# Patient Record
Sex: Female | Born: 1945 | Race: White | Hispanic: No | Marital: Married | Smoking: Former smoker
Health system: Southern US, Community
[De-identification: ages and names within clinical notes are randomized; demographics above are authoritative.]

---

## 2015-07-14 DIAGNOSIS — M170 Bilateral primary osteoarthritis of knee: Secondary | ICD-10-CM | POA: Insufficient documentation

## 2016-07-05 DIAGNOSIS — H8113 Benign paroxysmal vertigo, bilateral: Secondary | ICD-10-CM | POA: Insufficient documentation

## 2018-02-23 DIAGNOSIS — J4520 Mild intermittent asthma, uncomplicated: Secondary | ICD-10-CM | POA: Insufficient documentation

## 2019-07-26 DIAGNOSIS — M85852 Other specified disorders of bone density and structure, left thigh: Secondary | ICD-10-CM | POA: Insufficient documentation

## 2020-01-11 ENCOUNTER — Other Ambulatory Visit: Payer: Self-pay

## 2020-01-11 ENCOUNTER — Encounter: Payer: Self-pay | Admitting: Emergency Medicine

## 2020-01-11 ENCOUNTER — Emergency Department
Admission: EM | Admit: 2020-01-11 | Discharge: 2020-01-11 | Disposition: A | Discharge status: Home / Self Care | Payer: PRIVATE HEALTH INSURANCE | Source: Home / Self Care | Attending: Family Medicine | Admitting: Family Medicine

## 2020-01-11 ENCOUNTER — Emergency Department (INDEPENDENT_AMBULATORY_CARE_PROVIDER_SITE_OTHER): Payer: PRIVATE HEALTH INSURANCE

## 2020-01-11 DIAGNOSIS — M25552 Pain in left hip: Secondary | ICD-10-CM

## 2020-01-11 MED ORDER — HYDROCODONE-ACETAMINOPHEN 5-325 MG PO TABS: ORAL_TABLET | ORAL | 0 refills | Status: AC

## 2020-01-11 NOTE — Discharge Instructions (Addendum)
Apply ice pack for 20 to 30 minutes, 3 to 4 times daily  Continue until pain decreases.  Use a cane for support and stabilitiy.

## 2020-01-11 NOTE — ED Provider Notes (Signed)
[facility]    CSN: [id number] Arrival date & time: 01/11/20  1136      History   Chief Complaint Chief Complaint  Patient presents with   Hip Pain    left    HPI Hannah Bridges is a 74 y.o. female.   Patient is visiting from [place].  While standing in her hotel room last night, she rotated her body to her right to pick up an item.  She heard a popping sensation followed by sharp pain in her left hip area.  The pain improved somewhat last night after valium, muscle relaxer, and application of ice.  She has pain with weight bearing today.  The history is provided by the patient.  Hip Pain This is a new problem. The current episode started yesterday. The problem occurs constantly. The problem has not changed since onset.Pertinent negatives include no abdominal pain. The symptoms are aggravated by walking, twisting and standing. Nothing relieves the symptoms. Treatments tried: ice pack, valium, and muscle relaxer. The treatment provided mild relief.    History reviewed. No pertinent past medical history.  Patient Active Problem List   Diagnosis Date Noted   Osteopenia of neck of left femur 07/26/2019   Mild intermittent asthma, uncomplicated 02/23/2018   Benign paroxysmal positional vertigo due to bilateral vestibular disorder 07/05/2016   Primary osteoarthritis of both knees 07/14/2015   Intractable back pain 11/20/2009   Osteoarthritis 11/20/2009   Spinal stenosis of lumbar region 11/20/2009   Benign essential hypertension 03/10/2009   Mixed hyperlipidemia 03/10/2009    History reviewed. No pertinent surgical history.  OB History   No obstetric history on file.      Home Medications    Prior to Admission medications   Medication Sig Start Date End Date Taking? Authorizing Provider  ascorbic acid (VITAMIN C) 500 MG tablet Take by mouth daily.   Yes [provider]  Calcium Carbonate-Vitamin D 600-400 MG-UNIT tablet Take by  mouth daily.   Yes [provider]  Coenzyme Q10 200 MG capsule Take by mouth.   Yes [provider]  diazepam (VALIUM) 10 MG tablet take 1/2 tablet (5 mg) by mouth daily as needed for anxiety. max daily amount: 5 mg 12/24/19  Yes [provider]  lisinopril (ZESTRIL) 10 MG tablet Take 10 mg by mouth daily. 01/06/20  Yes [provider]  meloxicam (MOBIC) 15 MG tablet Take 1 tablet by mouth daily. 06/15/15  Yes [provider]  methylPREDNISolone (MEDROL DOSEPAK) 4 MG TBPK tablet Taper dose according to the directions in the packet. 01/03/20  Yes [provider]  Multiple Vitamin (MULTI-VITAMIN) tablet Take by mouth daily.   Yes [provider]  simvastatin (ZOCOR) 20 MG tablet Take 1 tablet by mouth at bedtime. 01/07/20  Yes [provider]  tiZANidine (ZANAFLEX) 2 MG tablet Take 1 tab Twice a day as needed 01/08/20  Yes [provider]  vitamin E 180 MG (400 UNITS) capsule Take by mouth daily.   Yes [provider]  HYDROcodone-acetaminophen (NORCO/VICODIN) 5-325 MG tablet Take one by mouth at bedtime as needed for pain 01/11/20   Lattie Haw, MD  meclizine (ANTIVERT) 25 MG tablet Take 1 tablet by mouth 3 (three) times daily as needed. 12/12/18   [provider]    Family History Family History  Problem Relation Age of Onset   Hypertension Mother    Diabetes Mother    Cancer Father    Hypertension Father  Thyroid disease Sister    Cancer Brother    Thyroid disease Sister    Stroke Sister    Healthy Brother     Social History Social History   Tobacco Use   Smoking status: Former Smoker    Types: Cigarettes    Quit date: 1991    Years since quitting: 30.4   Smokeless tobacco: Never Used  Building services engineer Use: Never used  Substance Use Topics   Alcohol use: Never   Drug use: Never     Allergies   Other   Review of Systems Review of Systems  Constitutional:  Positive for activity change. Negative for appetite change, chills, diaphoresis, fatigue and fever.  Gastrointestinal: Negative for abdominal pain.  Musculoskeletal:       Left hip pain  Neurological: Negative for weakness and numbness.  All other systems reviewed and are negative.    Physical Exam Triage Vital Signs ED Triage Vitals  Enc Vitals Group     BP 01/11/20 1158 (!) 163/92     Pulse Rate 01/11/20 1158 79     Resp 01/11/20 1158 18     Temp 01/11/20 1158 98.6 F (37 C)     Temp Source 01/11/20 1158 Oral     SpO2 01/11/20 1158 98 %     Weight 01/11/20 1200 227 lb (103 kg)     Height 01/11/20 1200 5\' 3"  (1.6 m)     Head Circumference --      Peak Flow --      Pain Score 01/11/20 1159 6     Pain Loc --      Pain Edu? --      Excl. in GC? --    No data found.  Updated Vital Signs BP (!) 163/92 (BP Location: Right Arm)    Pulse 79    Temp 98.6 F (37 C) (Oral)    Resp 18    Ht 5\' 3"  (1.6 m)    Wt 103 kg    SpO2 98%    BMI 40.21 kg/m   Visual Acuity Right Eye Distance:   Left Eye Distance:   Bilateral Distance:    Right Eye Near:   Left Eye Near:    Bilateral Near:     Physical Exam Constitutional:      General: She is not in acute distress.    Appearance: She is obese.  HENT:     Head: Normocephalic.     Nose: Nose normal.  Eyes:     Pupils: Pupils are equal, round, and reactive to light.  Cardiovascular:     Rate and Rhythm: Normal rate.     Heart sounds: Normal heart sounds.  Pulmonary:     Effort: Pulmonary effort is normal.     Breath sounds: Normal breath sounds.  Abdominal:     Tenderness: There is no abdominal tenderness.  Musculoskeletal:     Left hip: Tenderness and bony tenderness present. No crepitus. Decreased range of motion.     Right lower leg: No edema.     Left lower leg: No edema.       Legs:     Comments: There is tenderness in the vicinity of the left greater trochanter.  Patient cannot actively flex her left hip, and cannot  passively flex more than about 20 degrees.  She has pain with internal/external passive rotation of her left hip.  Skin:    General: Skin is warm and dry.  Findings: No rash.  Neurological:     Mental Status: She is alert.      UC Treatments / Results  Labs (all labs ordered are listed, but only abnormal results are displayed) Labs Reviewed - No data to display  EKG   Radiology DG Hip Unilat W or Wo Pelvis 2-3 Views Left  Result Date: 01/11/2020 CLINICAL DATA:  Sudden onset of left hip pain yesterday. EXAM: DG HIP (WITH OR WITHOUT PELVIS) 2-3V LEFT COMPARISON:  None. FINDINGS: There is no evidence of hip fracture or dislocation. There is no evidence of arthropathy or other focal bone abnormality. L4-5 lumbar discectomy and fusion is visible. IMPRESSION: Negative pelvis and left hip. Electronically Signed   By: Nelson Chimes M.D.   On: 01/11/2020 13:00    Procedures Procedures (including critical care time)  Medications Ordered in UC Medications - No data to display  Initial Impression / Assessment and Plan / UC Course  I have reviewed the triage vital signs and the nursing notes.  Pertinent labs & imaging results that were available during my care of the patient were reviewed by me and considered in my medical decision making (see chart for details).    Rx for Vicodin at bedtime (#5, no refill). Controlled Substance Prescriptions I have consulted the [place] Controlled Substances Registry for this patient, and feel the risk/benefit ratio today is favorable for proceeding with this prescription for a controlled substance.   Recommend follow-up with Sports Medicine physician in [place].   Final Clinical Impressions(s) / UC Diagnoses   Final diagnoses:  Acute pain of left hip     Discharge Instructions     Apply ice pack for 20 to 30 minutes, 3 to 4 times daily  Continue until pain decreases.  Use a cane for support and stabilitiy.    ED Prescriptions    Medication  Sig Dispense Auth. Provider   HYDROcodone-acetaminophen (NORCO/VICODIN) 5-325 MG tablet Take one by mouth at bedtime as needed for pain 5 tablet Kandra Nicolas, MD        Kandra Nicolas, MD 01/12/20 1304

## 2020-01-11 NOTE — ED Triage Notes (Signed)
Left hip pain after turning to right last night  Pt heard a cracking noise & sharp pain Took a muscle relaxer, valium & ice to hip all night - now able to stand on it  Pain radiates down to left calf COVID vaccine Pfizer 2nd one April 2021 [place] visiting from [place]

## 2021-01-06 IMAGING — DX DG HIP (WITH OR WITHOUT PELVIS) 2-3V*L*
3 series · 3 of 3 positions shown · non-contrast
Comparison: None.

CLINICAL DATA: Sudden onset of left hip pain yesterday.

EXAM:
DG HIP (WITH OR WITHOUT PELVIS) 2-3V LEFT

[pelvis ap]
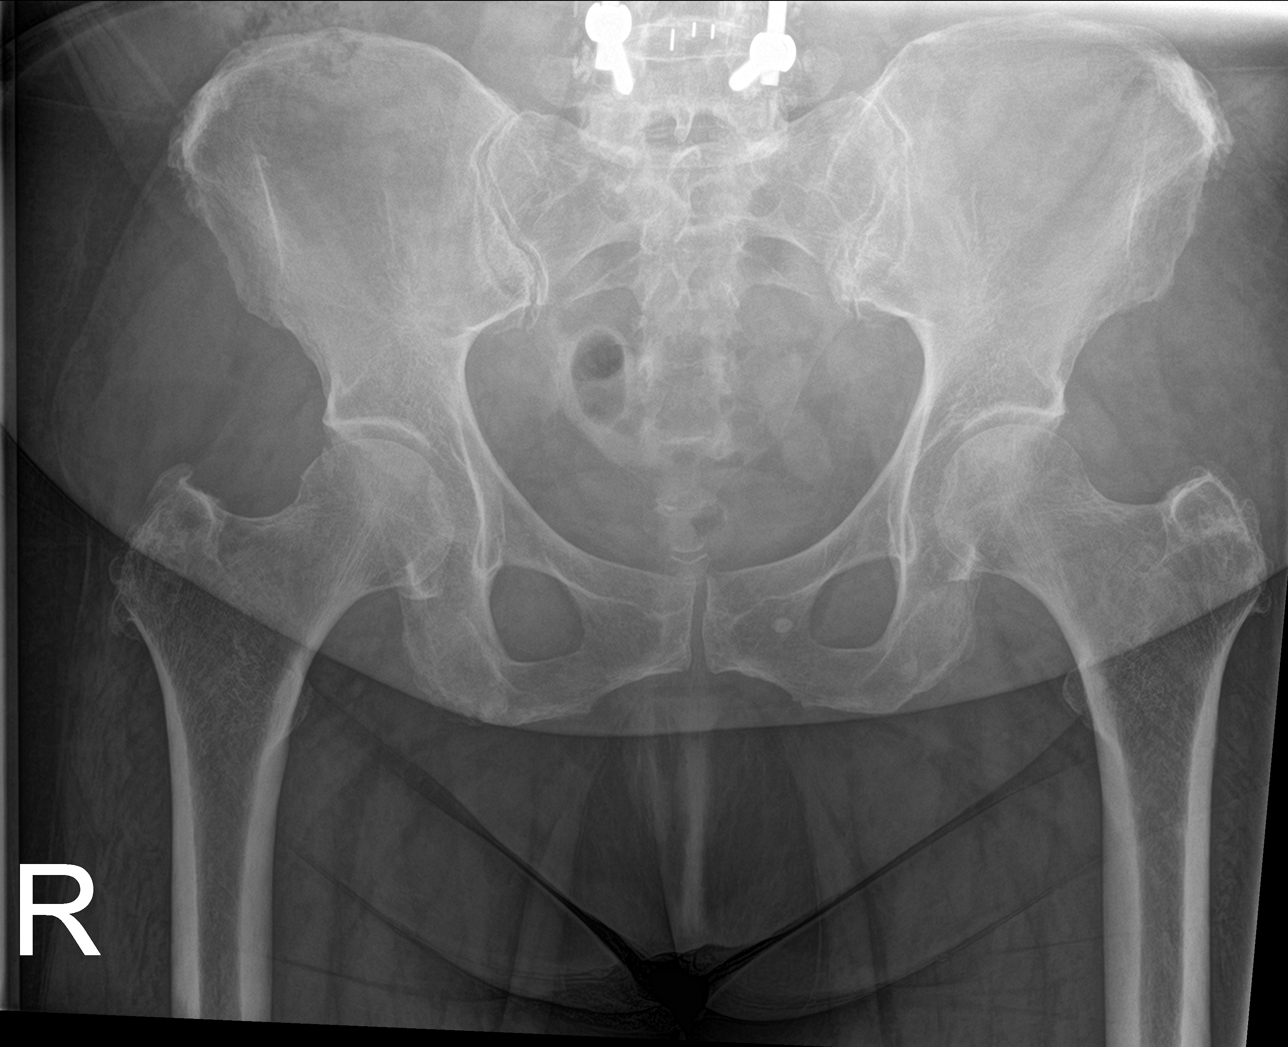

[hip ap]
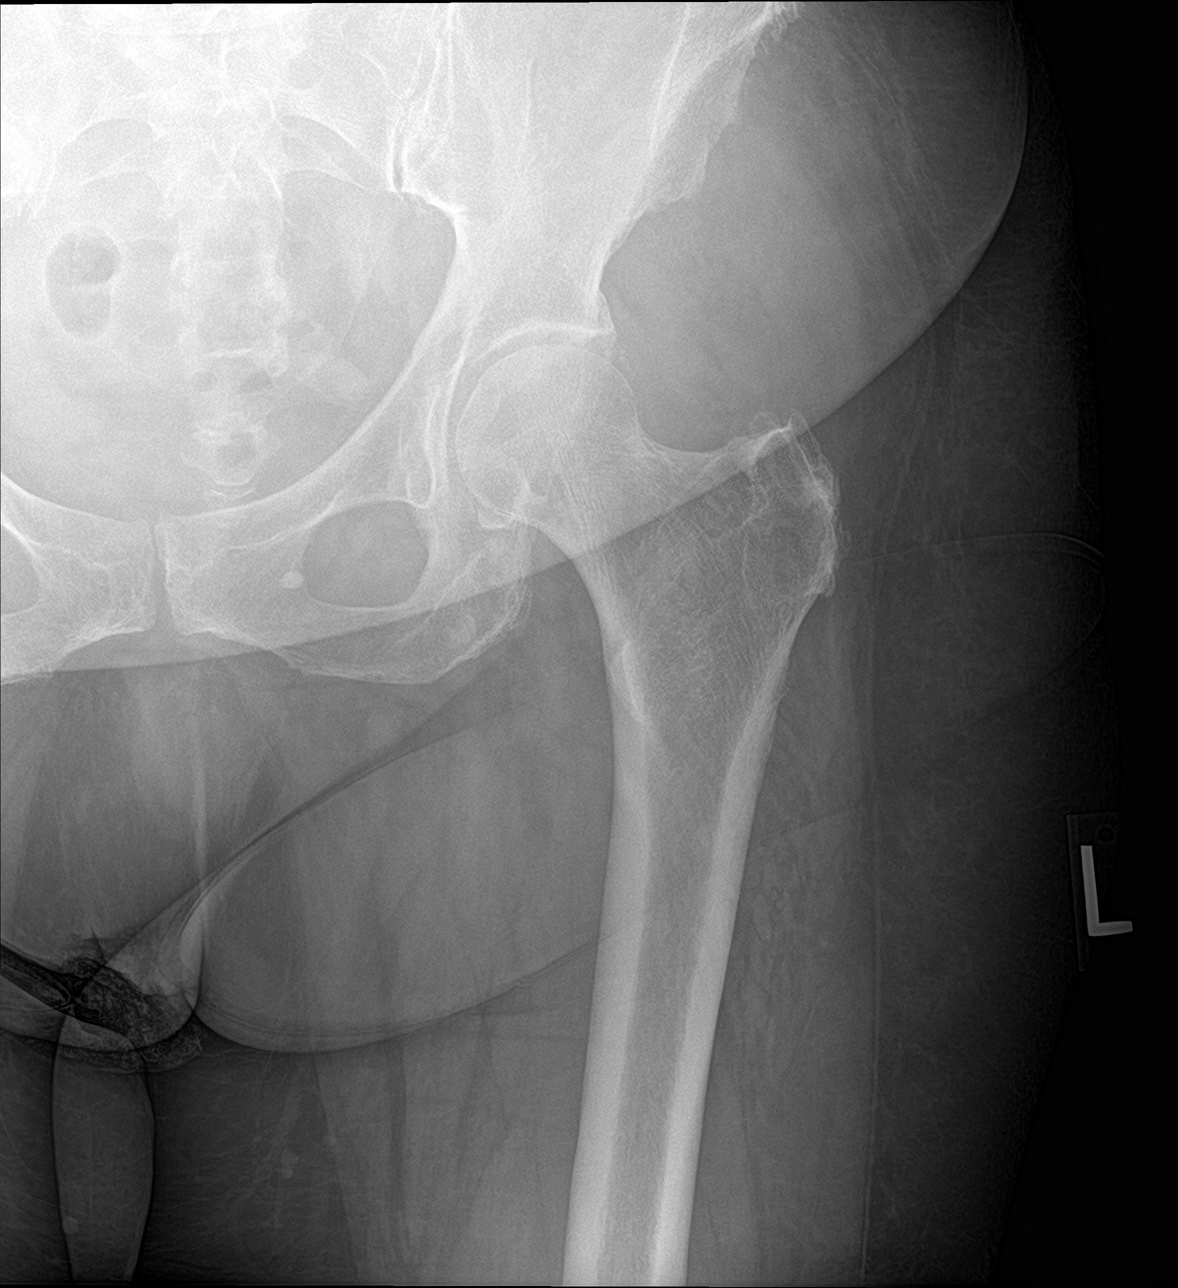

[hip lat]
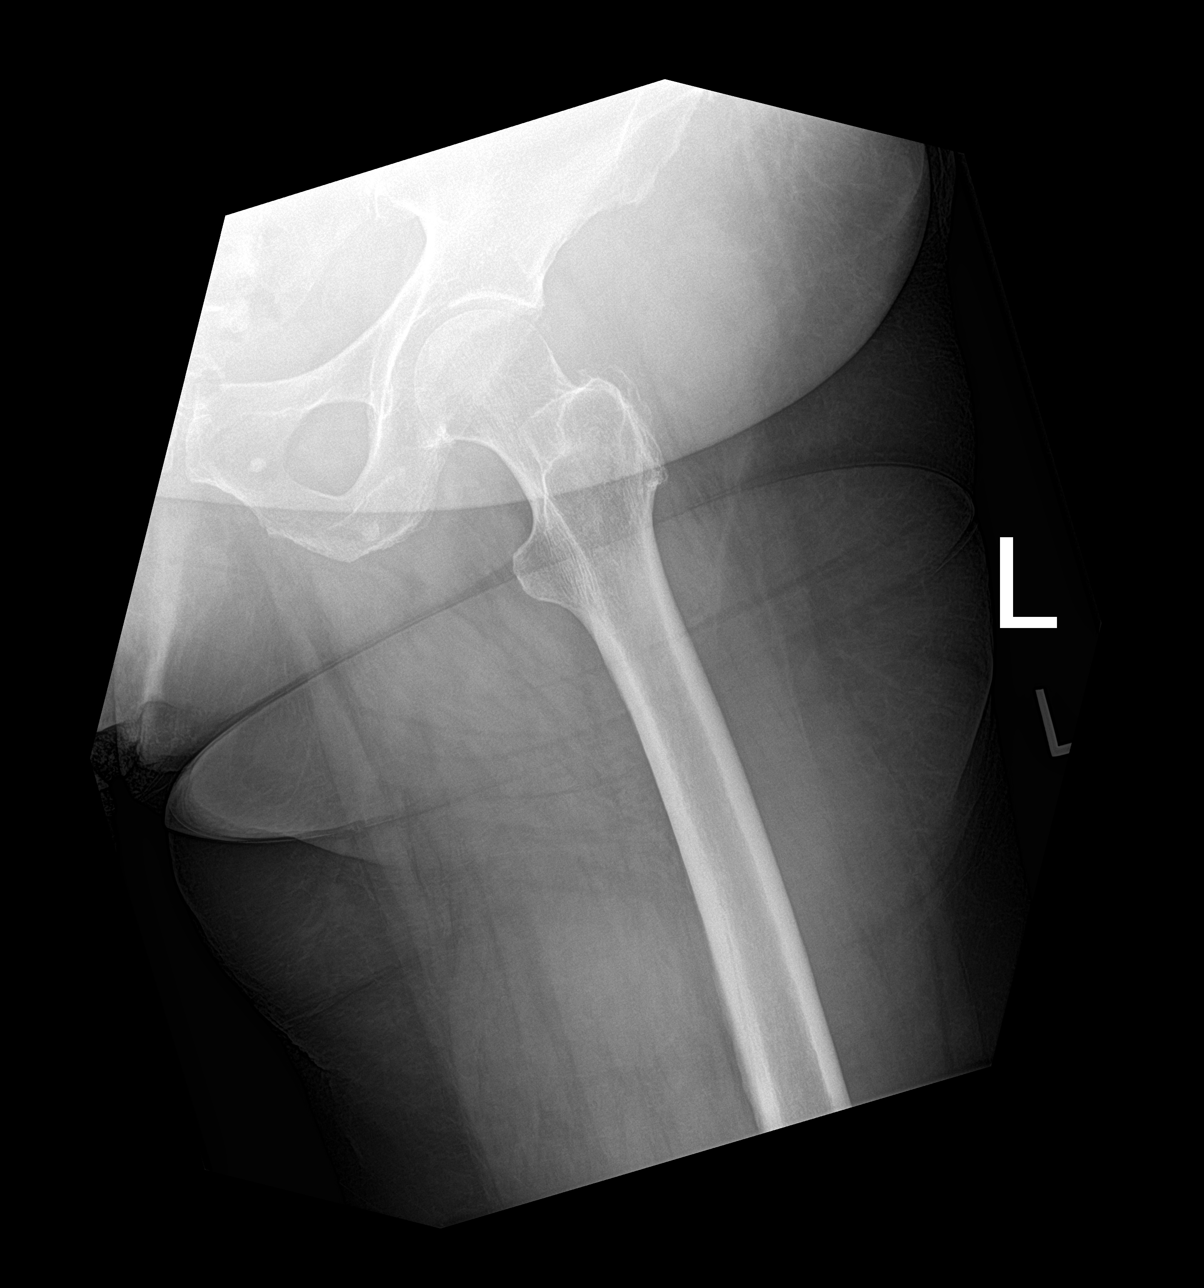

[3 of 3 positions shown; findings below may reference images not displayed]

FINDINGS: There is no evidence of hip fracture or dislocation. There is no
evidence of arthropathy or other focal bone abnormality. L4-5 lumbar
discectomy and fusion is visible.
IMPRESSION: Negative pelvis and left hip.
